# Patient Record
Sex: Male | Born: 2000 | Race: White | Hispanic: No | Marital: Single | Smoking: Never smoker
Health system: Southern US, Community
[De-identification: ages and names within clinical notes are randomized; demographics above are authoritative.]

## PROBLEM LIST (undated history)

## (undated) DIAGNOSIS — J45909 Unspecified asthma, uncomplicated: Secondary | ICD-10-CM

## (undated) DIAGNOSIS — K219 Gastro-esophageal reflux disease without esophagitis: Secondary | ICD-10-CM

## (undated) DIAGNOSIS — I370 Nonrheumatic pulmonary valve stenosis: Secondary | ICD-10-CM

## (undated) HISTORY — PX: OTHER SURGICAL HISTORY: SHX169

## (undated) HISTORY — DX: Nonrheumatic pulmonary valve stenosis: I37.0

## (undated) HISTORY — DX: Gastro-esophageal reflux disease without esophagitis: K21.9

## (undated) HISTORY — DX: Unspecified asthma, uncomplicated: J45.909

---
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
## 2021-06-14 DIAGNOSIS — R195 Other fecal abnormalities: Secondary | ICD-10-CM | POA: Diagnosis not present

## 2021-06-14 DIAGNOSIS — F419 Anxiety disorder, unspecified: Secondary | ICD-10-CM | POA: Diagnosis not present

## 2021-06-27 DIAGNOSIS — Z23 Encounter for immunization: Secondary | ICD-10-CM | POA: Diagnosis not present

## 2022-01-28 DIAGNOSIS — R109 Unspecified abdominal pain: Secondary | ICD-10-CM | POA: Diagnosis not present

## 2022-01-28 DIAGNOSIS — Z03818 Encounter for observation for suspected exposure to other biological agents ruled out: Secondary | ICD-10-CM | POA: Diagnosis not present

## 2022-01-28 DIAGNOSIS — R509 Fever, unspecified: Secondary | ICD-10-CM | POA: Diagnosis not present

## 2022-01-29 ENCOUNTER — Other Ambulatory Visit: Payer: Self-pay

## 2022-01-29 ENCOUNTER — Emergency Department (HOSPITAL_BASED_OUTPATIENT_CLINIC_OR_DEPARTMENT_OTHER)
Admission: EM | Admit: 2022-01-29 | Discharge: 2022-01-29 | Disposition: A | Discharge status: Home / Self Care | Payer: BC Managed Care – PPO | Attending: Emergency Medicine | Admitting: Emergency Medicine

## 2022-01-29 ENCOUNTER — Emergency Department (HOSPITAL_BASED_OUTPATIENT_CLINIC_OR_DEPARTMENT_OTHER): Payer: BC Managed Care – PPO

## 2022-01-29 ENCOUNTER — Encounter (HOSPITAL_BASED_OUTPATIENT_CLINIC_OR_DEPARTMENT_OTHER): Payer: Self-pay | Admitting: Emergency Medicine

## 2022-01-29 DIAGNOSIS — R197 Diarrhea, unspecified: Secondary | ICD-10-CM | POA: Insufficient documentation

## 2022-01-29 DIAGNOSIS — R110 Nausea: Secondary | ICD-10-CM | POA: Insufficient documentation

## 2022-01-29 DIAGNOSIS — R1013 Epigastric pain: Secondary | ICD-10-CM | POA: Insufficient documentation

## 2022-01-29 DIAGNOSIS — D696 Thrombocytopenia, unspecified: Secondary | ICD-10-CM | POA: Diagnosis not present

## 2022-01-29 DIAGNOSIS — R109 Unspecified abdominal pain: Secondary | ICD-10-CM | POA: Diagnosis not present

## 2022-01-29 DIAGNOSIS — R1033 Periumbilical pain: Secondary | ICD-10-CM | POA: Insufficient documentation

## 2022-01-29 DIAGNOSIS — R509 Fever, unspecified: Secondary | ICD-10-CM | POA: Insufficient documentation

## 2022-01-29 DIAGNOSIS — D72829 Elevated white blood cell count, unspecified: Secondary | ICD-10-CM | POA: Insufficient documentation

## 2022-01-29 LAB — CBC WITH DIFFERENTIAL/PLATELET
Abs Immature Granulocytes: 0.04 10*3/uL (ref 0.00–0.07)
Basophils Absolute: 0 10*3/uL (ref 0.0–0.1)
Basophils Relative: 0 %
Eosinophils Absolute: 0.1 10*3/uL (ref 0.0–0.5)
Eosinophils Relative: 1 %
HCT: 43 % (ref 39.0–52.0)
Hemoglobin: 14.6 g/dL (ref 13.0–17.0)
Immature Granulocytes: 0 %
Lymphocytes Relative: 4 %
Lymphs Abs: 0.5 10*3/uL — ABNORMAL LOW (ref 0.7–4.0)
MCH: 29 pg (ref 26.0–34.0)
MCHC: 34 g/dL (ref 30.0–36.0)
MCV: 85.3 fL (ref 80.0–100.0)
Monocytes Absolute: 1.1 10*3/uL — ABNORMAL HIGH (ref 0.1–1.0)
Monocytes Relative: 10 %
Neutro Abs: 9.7 10*3/uL — ABNORMAL HIGH (ref 1.7–7.7)
Neutrophils Relative %: 85 %
Platelets: 147 10*3/uL — ABNORMAL LOW (ref 150–400)
RBC: 5.04 MIL/uL (ref 4.22–5.81)
RDW: 11.8 % (ref 11.5–15.5)
WBC: 11.5 10*3/uL — ABNORMAL HIGH (ref 4.0–10.5)
nRBC: 0 % (ref 0.0–0.2)

## 2022-01-29 LAB — COMPREHENSIVE METABOLIC PANEL
ALT: 16 U/L (ref 0–44)
AST: 19 U/L (ref 15–41)
Albumin: 4.1 g/dL (ref 3.5–5.0)
Alkaline Phosphatase: 53 U/L (ref 38–126)
Anion gap: 12 (ref 5–15)
BUN: 8 mg/dL (ref 6–20)
CO2: 25 mmol/L (ref 22–32)
Calcium: 9.1 mg/dL (ref 8.9–10.3)
Chloride: 101 mmol/L (ref 98–111)
Creatinine, Ser: 0.83 mg/dL (ref 0.61–1.24)
GFR, Estimated: >60 mL/min (ref >60)
Glucose, Bld: 95 mg/dL (ref 70–99)
Potassium: 3.7 mmol/L (ref 3.5–5.1)
Sodium: 138 mmol/L (ref 135–145)
Total Bilirubin: 0.6 mg/dL (ref 0.3–1.2)
Total Protein: 7.1 g/dL (ref 6.5–8.1)

## 2022-01-29 LAB — URINALYSIS, ROUTINE W REFLEX MICROSCOPIC
Bilirubin Urine: NEGATIVE
Glucose, UA: NEGATIVE mg/dL
Hgb urine dipstick: NEGATIVE
Ketones, ur: 40 mg/dL — AB
Leukocytes,Ua: NEGATIVE
Nitrite: NEGATIVE
Protein, ur: 30 mg/dL — AB
Specific Gravity, Urine: >1.046 — ABNORMAL HIGH (ref 1.005–1.030)
pH: 6.5 (ref 5.0–8.0)

## 2022-01-29 LAB — LIPASE, BLOOD: Lipase: 16 U/L (ref 11–51)

## 2022-01-29 MED ORDER — ONDANSETRON 4 MG PO TBDP: 4.0000 mg | ORAL_TABLET | Freq: Three times a day (TID) | ORAL | 0 refills | Status: AC | PRN

## 2022-01-29 MED ORDER — DICYCLOMINE HCL 20 MG PO TABS: 20.0000 mg | ORAL_TABLET | Freq: Two times a day (BID) | ORAL | 0 refills | Status: AC

## 2022-01-29 MED ORDER — IOHEXOL 300 MG/ML  SOLN
100.0000 mL | Freq: Once | INTRAMUSCULAR | Status: AC | PRN
  Administered 2022-01-29: 75 mL via INTRAVENOUS

## 2022-01-29 NOTE — ED Triage Notes (Signed)
Pt from home c/o of high fever that was 102.9 along with nausea and epigastric abdominal pain. Fever continues to return. Abdominal pain is with exertion.  Pt was sent from his PCP today for elevated WBC.   Pt took tylenol at appox 1400 today before coming in.   Nausea has currently subsided.

## 2022-01-29 NOTE — ED Provider Notes (Signed)
[facility] EMERGENCY DEPT Provider Note   CSN: [id number] Arrival date & time: 01/29/22  1557     History  Chief Complaint  Patient presents with   Fever    Raymond Hoover is a 21 y.o. male who presents to the ED today with dad with complaint of fevers with TMAX 102.9 x 2 days. Pt also complains of nausea and epigastric pain. He reports pain is worse with movement/exertion however at rest it is manageable. He also has some mild diarrhea. He went to his PCP's office yesterday who drew bloodwork and tested patient for COVID, flu, and RSV all of which he was negative for. They called today and reported pt's WBC was 16,000 and advised him to come to the ED for further eval. Pt denies any recent sick contacts. No previous abd surgeries.   The history is provided by the patient, a parent and medical records.      Home Medications Prior to Admission medications   Medication Sig Start Date End Date Taking? Authorizing Provider  dicyclomine (BENTYL) 20 MG tablet Take 1 tablet (20 mg total) by mouth 2 (two) times daily. 01/29/22  Yes Cory Kitt, PA-C  ondansetron (ZOFRAN-ODT) 4 MG disintegrating tablet Take 1 tablet (4 mg total) by mouth every 8 (eight) hours as needed for nausea or vomiting. 01/29/22  Yes Abner Ardis, PA-C  albuterol (PROAIR HFA) 108 (90 BASE) MCG/ACT inhaler Inhale into the lungs.    [provider]  NEOMYCIN-POLYMYXIN-HYDROCORTISONE (CORTISPORIN) 1 % SOLN otic solution Apply 1-2 drops to toe BID after soaking 05/16/15   Hyatt, Max T, DPM      Allergies    Sulfa antibiotics    Review of Systems   Review of Systems  Constitutional:  Positive for fever.  HENT:  Negative for sore throat.   Respiratory:  Negative for cough and shortness of breath.   Cardiovascular:  Negative for chest pain.  Gastrointestinal:  Positive for abdominal pain, diarrhea and nausea. Negative for vomiting.  Genitourinary:  Negative for flank pain and frequency.  All  other systems reviewed and are negative.  Physical Exam Updated Vital Signs BP 116/75   Pulse 83   Temp 98.7 F (37.1 C) (Oral)   Resp 18   Ht 5\' 10"  (1.778 m)   Wt 66.2 kg   SpO2 98%   BMI 20.95 kg/m  Physical Exam Vitals and nursing note reviewed.  Constitutional:      Appearance: He is not ill-appearing or diaphoretic.  HENT:     Head: Normocephalic and atraumatic.  Eyes:     Conjunctiva/sclera: Conjunctivae normal.  Cardiovascular:     Rate and Rhythm: Normal rate and regular rhythm.     Pulses: Normal pulses.  Pulmonary:     Effort: Pulmonary effort is normal.     Breath sounds: Normal breath sounds. No wheezing, rhonchi or rales.  Abdominal:     Palpations: Abdomen is soft.     Tenderness: There is abdominal tenderness. There is no guarding or rebound.     Comments: Soft, + periumbilical and epigastric abd TTP, +BS throughout, no r/g/r, neg murphy's, neg mcburney's, no CVA TTP  Musculoskeletal:     Cervical back: Neck supple.  Skin:    General: Skin is warm and dry.  Neurological:     Mental Status: He is alert.    ED Results / Procedures / Treatments   Labs (all labs ordered are listed, but only abnormal results are displayed) Labs Reviewed  CBC WITH  DIFFERENTIAL/PLATELET - Abnormal; Notable for the following components:      Result Value   WBC 11.5 (*)    Platelets 147 (*)    Neutro Abs 9.7 (*)    Lymphs Abs 0.5 (*)    Monocytes Absolute 1.1 (*)    All other components within normal limits  COMPREHENSIVE METABOLIC PANEL  LIPASE, BLOOD  URINALYSIS, ROUTINE W REFLEX MICROSCOPIC    EKG None  Radiology No results found.  Procedures Procedures    Medications Ordered in ED Medications  iohexol (OMNIPAQUE) 300 MG/ML solution 100 mL (75 mLs Intravenous Contrast Given 01/29/22 1801)    ED Course/ Medical Decision Making/ A&P                           Medical Decision Making 21 year old male who presents to the ED today after seeing PCP  yesterday with complaint of fevers, nausea, diarrhea, abdominal pain.  Noted to have a white blood cell count of 16,000 advised to come to the ED for further eval.  On arrival to the ED today patient is afebrile, nontachycardic and nontachypneic.  He appears to be in no acute distress at this time.  He is laying comfortably in bed with dad at bedside.  He is noted to have some mild periumbilical abdominal tenderness palpation as well as epigastric abdominal tenderness palpation.  We will plan for repeat labs at this time as I am unable to see the labs in the system as well as CT scan for further eval.  Had offered fluids, antiemetics, pain medication however patient would like to hold off at this time.  We will continue to monitor in the ED.   CBC with leukocytosis 11,500 without left shift. Hgb stable. Platelets slightly low at 147 however do not have previous to compare to.  CMP without electrolyte abnormalities. LFTs unremarkable.  Lipase WNL at 16  Amount and/or Complexity of Data Reviewed Labs: ordered. Decision-making details documented in ED Course. Radiology: ordered.  Risk Prescription drug management.   At shift change case signed out to [facility], PA-C, who will follow up on CT scan and urinalysis and dispo accordingly. If negative pt can be discharged with PCP follow up. Zofran and Bentyl sent to pharmacy.         Final Clinical Impression(s) / ED Diagnoses Final diagnoses:  Periumbilical abdominal pain  Fever, unspecified fever cause    Rx / DC Orders ED Discharge Orders          Ordered    ondansetron (ZOFRAN-ODT) 4 MG disintegrating tablet  Every 8 hours PRN        01/29/22 1825    dicyclomine (BENTYL) 20 MG tablet  2 times daily        01/29/22 1827              Tanda Rockers, PA-C 01/29/22 1827    Franne Forts, DO 02/04/22 2345

## 2022-01-29 NOTE — ED Provider Notes (Signed)
Accepted handoff at shift change from [facility]. Please see prior provider note for more detail.   Briefly: Patient is 21 y.o.   2 days of fever and abdominal pain.  Was seen at PCP office yesterday and found to have a WBC of 16,000 and sent to emergency room  DDX: concern for appendicitis  Plan: FU on CT scan and urinalysis     Physical Exam  BP 116/75   Pulse 83   Temp 98.7 F (37.1 C) (Oral)   Resp 18   Ht 5\' 10"  (1.778 m)   Wt 66.2 kg   SpO2 98%   BMI 20.95 kg/m   Physical Exam Vitals and nursing note reviewed.  Constitutional:      General: He is not in acute distress. HENT:     Head: Normocephalic and atraumatic.     Nose: Nose normal.  Eyes:     General: No scleral icterus. Cardiovascular:     Rate and Rhythm: Normal rate and regular rhythm.     Pulses: Normal pulses.     Heart sounds: Normal heart sounds.  Pulmonary:     Effort: Pulmonary effort is normal. No respiratory distress.     Breath sounds: No wheezing.  Abdominal:     Palpations: Abdomen is soft.     Tenderness: There is abdominal tenderness.     Comments: Some diffuse lower abdominal tenderness.  No guarding or rebound  Musculoskeletal:     Cervical back: Normal range of motion.     Right lower leg: No edema.     Left lower leg: No edema.  Skin:    General: Skin is warm and dry.     Capillary Refill: Capillary refill takes less than 2 seconds.  Neurological:     Mental Status: He is alert. Mental status is at baseline.  Psychiatric:        Mood and Affect: Mood normal.        Behavior: Behavior normal.    Procedures  Procedures Results for orders placed or performed during the hospital encounter of 01/29/22  CBC with Differential  Result Value Ref Range   WBC 11.5 (H) 4.0 - 10.5 K/uL   RBC 5.04 4.22 - 5.81 MIL/uL   Hemoglobin 14.6 13.0 - 17.0 g/dL   HCT 01/31/22 64.3 - 32.9 %   MCV 85.3 80.0 - 100.0 fL   MCH 29.0 26.0 - 34.0 pg   MCHC 34.0 30.0 - 36.0 g/dL   RDW 51.8 84.1 -  66.0 %   Platelets 147 (L) 150 - 400 K/uL   nRBC 0.0 0.0 - 0.2 %   Neutrophils Relative % 85 %   Neutro Abs 9.7 (H) 1.7 - 7.7 K/uL   Lymphocytes Relative 4 %   Lymphs Abs 0.5 (L) 0.7 - 4.0 K/uL   Monocytes Relative 10 %   Monocytes Absolute 1.1 (H) 0.1 - 1.0 K/uL   Eosinophils Relative 1 %   Eosinophils Absolute 0.1 0.0 - 0.5 K/uL   Basophils Relative 0 %   Basophils Absolute 0.0 0.0 - 0.1 K/uL   Immature Granulocytes 0 %   Abs Immature Granulocytes 0.04 0.00 - 0.07 K/uL  Comprehensive metabolic panel  Result Value Ref Range   Sodium 138 135 - 145 mmol/L   Potassium 3.7 3.5 - 5.1 mmol/L   Chloride 101 98 - 111 mmol/L   CO2 25 22 - 32 mmol/L   Glucose, Bld 95 70 - 99 mg/dL   BUN 8 6 -  20 mg/dL   Creatinine, Ser 3.82 0.61 - 1.24 mg/dL   Calcium 9.1 8.9 - 50.5 mg/dL   Total Protein 7.1 6.5 - 8.1 g/dL   Albumin 4.1 3.5 - 5.0 g/dL   AST 19 15 - 41 U/L   ALT 16 0 - 44 U/L   Alkaline Phosphatase 53 38 - 126 U/L   Total Bilirubin 0.6 0.3 - 1.2 mg/dL   GFR, Estimated >39 >76 mL/min   Anion gap 12 5 - 15  Lipase, blood  Result Value Ref Range   Lipase 16 11 - 51 U/L   No results found.  ED Course / MDM   Clinical Course as of 01/29/22 1932  Tue Jan 29, 2022  1928 Discussed with Dr. Londell Moh in [facility] radiology.  On his over read he is able to see the appendix and it does not have any stranding or enlargement around it.  He also appreciates the mesenteric lymphadenitis that was seen on the original read.  Appendix 7.6 mm without stranding.  [WF]    Clinical Course User Index [WF] Gailen Shelter, PA   Medical Decision Making Amount and/or Complexity of Data Reviewed Labs: ordered. Radiology: ordered.  Risk Prescription drug management.    Urinalysis consistent with some dehydration.  CMP unremarkable CBC with mild leukocytosis that is neutrophil predominant.  Lipase within normal limits.  7:31 PM Discussed with Dr. Londell Moh in [facility] radiology.  On his over  read he is able to see the appendix and it does not have any stranding or enlargement around it.  He also appreciates the mesenteric lymphadenitis that was seen on the original read.  Appendix 7.6 mm without stranding.   We will discharge home with 48-hour follow-up, Zofran and Bentyl prescribed by prior provider.  I recommended Tylenol ibuprofen and very close monitoring of symptoms.    Gailen Shelter, [place] 01/29/22 1932    Franne Forts, DO 02/04/22 [id number]

## 2022-01-29 NOTE — Discharge Instructions (Addendum)
Your workup was overall reassuring in the ED today. It is recommended that you follow back up with your PCP for further evaluation.   Pick up medications and take as needed for nausea.   Return to the ED for any new/worsening symptoms   Please use Tylenol or ibuprofen for pain.  You may use 600 mg ibuprofen every 6 hours or 1000 mg of Tylenol every 6 hours.  You may choose to alternate between the 2.  This would be most effective.  Not to exceed 4 g of Tylenol within 24 hours.  Not to exceed 3200 mg ibuprofen 24 hours.   Please follow-up with a primary care provider in the next 48 hours for reevaluation.  You may always return to the emergency room for any new or concerning symptoms.

## 2022-02-01 DIAGNOSIS — I880 Nonspecific mesenteric lymphadenitis: Secondary | ICD-10-CM | POA: Diagnosis not present

## 2022-02-01 DIAGNOSIS — R509 Fever, unspecified: Secondary | ICD-10-CM | POA: Diagnosis not present

## 2022-02-05 DIAGNOSIS — R509 Fever, unspecified: Secondary | ICD-10-CM | POA: Diagnosis not present

## 2022-05-09 DIAGNOSIS — R634 Abnormal weight loss: Secondary | ICD-10-CM | POA: Diagnosis not present

## 2022-05-09 DIAGNOSIS — I880 Nonspecific mesenteric lymphadenitis: Secondary | ICD-10-CM | POA: Diagnosis not present

## 2022-05-09 DIAGNOSIS — L609 Nail disorder, unspecified: Secondary | ICD-10-CM | POA: Diagnosis not present

## 2022-05-09 DIAGNOSIS — R110 Nausea: Secondary | ICD-10-CM | POA: Diagnosis not present

## 2022-06-10 DIAGNOSIS — L219 Seborrheic dermatitis, unspecified: Secondary | ICD-10-CM | POA: Diagnosis not present

## 2022-06-10 DIAGNOSIS — L650 Telogen effluvium: Secondary | ICD-10-CM | POA: Diagnosis not present

## 2022-07-01 DIAGNOSIS — R634 Abnormal weight loss: Secondary | ICD-10-CM | POA: Diagnosis not present

## 2022-07-01 DIAGNOSIS — R110 Nausea: Secondary | ICD-10-CM | POA: Diagnosis not present

## 2022-07-01 DIAGNOSIS — K219 Gastro-esophageal reflux disease without esophagitis: Secondary | ICD-10-CM | POA: Diagnosis not present

## 2022-07-01 DIAGNOSIS — R1013 Epigastric pain: Secondary | ICD-10-CM | POA: Diagnosis not present

## 2022-07-25 DIAGNOSIS — R1013 Epigastric pain: Secondary | ICD-10-CM | POA: Diagnosis not present

## 2022-07-25 DIAGNOSIS — K2930 Chronic superficial gastritis without bleeding: Secondary | ICD-10-CM | POA: Diagnosis not present

## 2022-07-25 DIAGNOSIS — R110 Nausea: Secondary | ICD-10-CM | POA: Diagnosis not present

## 2022-07-25 DIAGNOSIS — R634 Abnormal weight loss: Secondary | ICD-10-CM | POA: Diagnosis not present

## 2022-08-07 DIAGNOSIS — H5203 Hypermetropia, bilateral: Secondary | ICD-10-CM | POA: Diagnosis not present

## 2022-08-21 DIAGNOSIS — R634 Abnormal weight loss: Secondary | ICD-10-CM | POA: Diagnosis not present

## 2022-08-21 DIAGNOSIS — R110 Nausea: Secondary | ICD-10-CM | POA: Diagnosis not present

## 2022-09-25 DIAGNOSIS — U071 COVID-19: Secondary | ICD-10-CM | POA: Diagnosis not present

## 2022-10-08 DIAGNOSIS — K2970 Gastritis, unspecified, without bleeding: Secondary | ICD-10-CM | POA: Diagnosis not present

## 2023-02-20 DIAGNOSIS — F419 Anxiety disorder, unspecified: Secondary | ICD-10-CM | POA: Diagnosis not present

## 2023-02-20 DIAGNOSIS — K582 Mixed irritable bowel syndrome: Secondary | ICD-10-CM | POA: Diagnosis not present

## 2023-02-20 DIAGNOSIS — K219 Gastro-esophageal reflux disease without esophagitis: Secondary | ICD-10-CM | POA: Diagnosis not present

## 2023-08-21 DIAGNOSIS — Z0000 Encounter for general adult medical examination without abnormal findings: Secondary | ICD-10-CM | POA: Diagnosis not present

## 2023-09-11 DIAGNOSIS — Z1159 Encounter for screening for other viral diseases: Secondary | ICD-10-CM | POA: Diagnosis not present

## 2023-09-11 DIAGNOSIS — Z0000 Encounter for general adult medical examination without abnormal findings: Secondary | ICD-10-CM | POA: Diagnosis not present

## 2023-09-11 DIAGNOSIS — Z13220 Encounter for screening for lipoid disorders: Secondary | ICD-10-CM | POA: Diagnosis not present

## 2023-09-11 DIAGNOSIS — Z83438 Family history of other disorder of lipoprotein metabolism and other lipidemia: Secondary | ICD-10-CM | POA: Diagnosis not present

## 2023-09-18 DIAGNOSIS — Z23 Encounter for immunization: Secondary | ICD-10-CM | POA: Diagnosis not present

## 2023-11-27 DIAGNOSIS — K219 Gastro-esophageal reflux disease without esophagitis: Secondary | ICD-10-CM | POA: Diagnosis not present

## 2023-11-27 DIAGNOSIS — R1310 Dysphagia, unspecified: Secondary | ICD-10-CM | POA: Diagnosis not present

## 2023-11-27 DIAGNOSIS — Z711 Person with feared health complaint in whom no diagnosis is made: Secondary | ICD-10-CM | POA: Diagnosis not present

## 2024-01-19 DIAGNOSIS — F4321 Adjustment disorder with depressed mood: Secondary | ICD-10-CM | POA: Diagnosis not present

## 2024-01-26 DIAGNOSIS — F4321 Adjustment disorder with depressed mood: Secondary | ICD-10-CM | POA: Diagnosis not present

## 2024-02-03 DIAGNOSIS — F4321 Adjustment disorder with depressed mood: Secondary | ICD-10-CM | POA: Diagnosis not present

## 2024-02-07 ENCOUNTER — Encounter (HOSPITAL_BASED_OUTPATIENT_CLINIC_OR_DEPARTMENT_OTHER): Payer: Self-pay

## 2024-02-07 ENCOUNTER — Emergency Department (HOSPITAL_BASED_OUTPATIENT_CLINIC_OR_DEPARTMENT_OTHER)

## 2024-02-07 ENCOUNTER — Emergency Department (HOSPITAL_BASED_OUTPATIENT_CLINIC_OR_DEPARTMENT_OTHER)
Admission: EM | Admit: 2024-02-07 | Discharge: 2024-02-07 | Disposition: A | Discharge status: Home / Self Care | Attending: Emergency Medicine | Admitting: Emergency Medicine

## 2024-02-07 ENCOUNTER — Other Ambulatory Visit: Payer: Self-pay

## 2024-02-07 DIAGNOSIS — R002 Palpitations: Secondary | ICD-10-CM | POA: Insufficient documentation

## 2024-02-07 DIAGNOSIS — R079 Chest pain, unspecified: Secondary | ICD-10-CM | POA: Diagnosis not present

## 2024-02-07 LAB — CBC
HCT: 45.7 % (ref 39.0–52.0)
Hemoglobin: 15.6 g/dL (ref 13.0–17.0)
MCH: 29.7 pg (ref 26.0–34.0)
MCHC: 34.1 g/dL (ref 30.0–36.0)
MCV: 87 fL (ref 80.0–100.0)
Platelets: 264 10*3/uL (ref 150–400)
RBC: 5.25 MIL/uL (ref 4.22–5.81)
RDW: 11.8 % (ref 11.5–15.5)
WBC: 10.9 10*3/uL — ABNORMAL HIGH (ref 4.0–10.5)
nRBC: 0 % (ref 0.0–0.2)

## 2024-02-07 LAB — D-DIMER, QUANTITATIVE: D-Dimer, Quant: <0.27 ug{FEU}/mL (ref 0.00–0.50)

## 2024-02-07 LAB — BASIC METABOLIC PANEL WITH GFR
Anion gap: 15 (ref 5–15)
BUN: 14 mg/dL (ref 6–20)
CO2: 26 mmol/L (ref 22–32)
Calcium: 9.9 mg/dL (ref 8.9–10.3)
Chloride: 99 mmol/L (ref 98–111)
Creatinine, Ser: 1.01 mg/dL (ref 0.61–1.24)
GFR, Estimated: >60 mL/min (ref >60)
Glucose, Bld: 125 mg/dL — ABNORMAL HIGH (ref 70–99)
Potassium: 3.4 mmol/L — ABNORMAL LOW (ref 3.5–5.1)
Sodium: 140 mmol/L (ref 135–145)

## 2024-02-07 LAB — TROPONIN T, HIGH SENSITIVITY: Troponin T High Sensitivity: <15 ng/L (ref <19)

## 2024-02-07 MED ORDER — LACTATED RINGERS IV BOLUS
1000.0000 mL | Freq: Once | INTRAVENOUS | Status: AC
Start: 2024-02-07 — End: 2024-02-07
  Administered 2024-02-07: 1000 mL via INTRAVENOUS

## 2024-02-07 NOTE — ED Provider Notes (Signed)
 [facility] EMERGENCY DEPARTMENT AT [facility] Provider Note   CSN: [id number] Arrival date & time: 02/07/24  0110     History Chief Complaint  Patient presents with   Chest Pain    HPI Raymond Hoover is a 23 y.o. male presenting for chief complaint of chest pain.  States that he has a history of near syncope and that he was at a concert today.  Decreased p.o. intake Patient's recorded medical, surgical, social, medication list and allergies were reviewed in the Snapshot window as part of the initial history.   Review of Systems   Review of Systems  Constitutional:  Negative for chills and fever.  HENT:  Negative for ear pain and sore throat.   Eyes:  Negative for pain and visual disturbance.  Respiratory:  Negative for cough and shortness of breath.   Cardiovascular:  Positive for chest pain and palpitations.  Gastrointestinal:  Negative for abdominal pain and vomiting.  Genitourinary:  Negative for dysuria and hematuria.  Musculoskeletal:  Negative for arthralgias and back pain.  Skin:  Negative for color change and rash.  Neurological:  Negative for seizures and syncope.  All other systems reviewed and are negative.   Physical Exam Updated Vital Signs BP 123/85   Pulse 86   Temp 99.7 F (37.6 C) (Oral)   Resp 18   Ht 5\' 11"  (1.803 m)   Wt 61.2 kg   SpO2 98%   BMI 18.83 kg/m  Physical Exam Vitals and nursing note reviewed.  Constitutional:      General: He is not in acute distress.    Appearance: He is well-developed.  HENT:     Head: Normocephalic and atraumatic.  Eyes:     Conjunctiva/sclera: Conjunctivae normal.  Cardiovascular:     Rate and Rhythm: Normal rate and regular rhythm.     Heart sounds: No murmur heard. Pulmonary:     Effort: Pulmonary effort is normal. No respiratory distress.     Breath sounds: Normal breath sounds.  Abdominal:     Palpations: Abdomen is soft.     Tenderness: There is no abdominal tenderness.  Musculoskeletal:         General: No swelling.     Cervical back: Neck supple.  Skin:    General: Skin is warm and dry.     Capillary Refill: Capillary refill takes less than 2 seconds.  Neurological:     Mental Status: He is alert.  Psychiatric:        Mood and Affect: Mood normal.      ED Course/ Medical Decision Making/ A&P    Procedures Procedures   Medications Ordered in ED Medications  lactated ringers bolus 1,000 mL (0 mLs Intravenous Stopped 02/07/24 0154)   Medical Decision Making: [name] is a 23 y.o. male who presented to the ED today with chest pain, detailed above.  Based on patient's comorbidities, patient has a heart score of 0.    Patient placed on continuous vitals and telemetry monitoring while in ED which was reviewed periodically.  Complete initial physical exam performed, notably the patient was HDS in NAD.   Reviewed and confirmed nursing documentation for past medical history, family history, social history.    Initial Assessment: With the patient's presentation of left-sided chest pain, most likely diagnosis is musculoskeletal chest pain versus GERD, although ACS remains on the differential. Other diagnoses were considered including (but not limited to) pulmonary embolism, community-acquired pneumonia, aortic dissection, pneumothorax, underlying bony abnormality, anemia. These are  considered less likely due to history of present illness and physical exam findings.    In particular, concerning pulmonary embolism: Patient is PERC + and the they deny malignancy, recent surgery, history of DVT, or calf tenderness leading to a low risk Wells score. Aortic Dissection also reconsidered but seems less likely based on the location, quality, onset, and severity of symptoms in this case. Patient has a lack of serious comorbidities for this condition including a lack of HTN or Smoking. Patient also has a lack of underlying history of AD or TAA.  This is most consistent with an acute  life/limb threatening illness complicated by underlying chronic conditions.   Initial Plan: EKG and single troponin to evaluate for cardiac pathology. Single troponin appropriate due to greater than 6 hours since maximal intensity of symptoms. Evaluate for dissection, bony abnormality, or pneumonia with chest x-ray and screening laboratory evaluation including CBC, BMP  Further evaluation for pulmonary embolism  indicated at this time based on patient's PERC and Wells score. Ddimer since low Wells Further evaluation for Thoracic Aortic Dissection not indicated at this time based on patient's clinical history and PE findings.   Initial Study Results: KG was reviewed independently. Rate, rhythm, axis, intervals all examined and without medically relevant abnormality. ST segments without concerns for elevations.    Laboratory  Single troponin demonstrated NAA   CBC and BMP without obvious metabolic or inflammatory abnormalities requiring further evaluation   Radiology  DG Chest Port 1 View Result Date: 02/07/2024 CLINICAL DATA:  Palpitations and chest pain. EXAM: PORTABLE CHEST 1 VIEW COMPARISON:  None Available. FINDINGS: Heart size and pulmonary vascularity are normal. Lungs are clear. No pleural effusions or pneumothorax. Thoracolumbar scoliosis. IMPRESSION: No active disease. Electronically Signed   By: Boyce Byes M.D.   On: 02/07/2024 01:49    Final Assessment and Plan: D-dimer showed no acute pathology. Troponin negative.  Heart rate down trended after IV fluids.  Patient symptomatically improved.  Supportive care reinforced patient ambulatory tolerating p.o. intake.  Disposition:  I have considered need for hospitalization, however, considering all of the above, I believe this patient is stable for discharge at this time.  Patient/family educated about specific return precautions for given chief complaint and symptoms.  Patient/family educated about follow-up with PCP.      Patient/family expressed understanding of return precautions and need for follow-up. Patient spoken to regarding all imaging and laboratory results and appropriate follow up for these results. All education provided in verbal form with additional information in written form. Time was allowed for answering of patient questions. Patient discharged.    Emergency Department Medication Summary:   Medications  lactated ringers bolus 1,000 mL (0 mLs Intravenous Stopped 02/07/24 0154)                  Clinical Impression:  1. Palpitations      Discharge   Final Clinical Impression(s) / ED Diagnoses Final diagnoses:  Palpitations    Rx / DC Orders ED Discharge Orders     None         Onetha Bile, MD 02/07/24 [phone]

## 2024-02-07 NOTE — ED Triage Notes (Signed)
 Complaining of heart palpitations and chest pain that started at 9 pm. Denies any nausea or vomiting. Started after eating his supper.

## 2024-02-07 NOTE — ED Notes (Signed)
 ED Provider at bedside.

## 2024-02-09 DIAGNOSIS — F4321 Adjustment disorder with depressed mood: Secondary | ICD-10-CM | POA: Diagnosis not present

## 2024-02-16 DIAGNOSIS — F4321 Adjustment disorder with depressed mood: Secondary | ICD-10-CM | POA: Diagnosis not present

## 2024-02-23 DIAGNOSIS — F4321 Adjustment disorder with depressed mood: Secondary | ICD-10-CM | POA: Diagnosis not present

## 2024-03-01 DIAGNOSIS — F4321 Adjustment disorder with depressed mood: Secondary | ICD-10-CM | POA: Diagnosis not present

## 2024-03-08 DIAGNOSIS — F4321 Adjustment disorder with depressed mood: Secondary | ICD-10-CM | POA: Diagnosis not present

## 2024-03-15 DIAGNOSIS — F4321 Adjustment disorder with depressed mood: Secondary | ICD-10-CM | POA: Diagnosis not present

## 2024-03-22 DIAGNOSIS — F4321 Adjustment disorder with depressed mood: Secondary | ICD-10-CM | POA: Diagnosis not present

## 2024-03-29 DIAGNOSIS — F4321 Adjustment disorder with depressed mood: Secondary | ICD-10-CM | POA: Diagnosis not present

## 2024-04-02 DIAGNOSIS — R000 Tachycardia, unspecified: Secondary | ICD-10-CM | POA: Diagnosis not present

## 2024-04-02 DIAGNOSIS — R0789 Other chest pain: Secondary | ICD-10-CM | POA: Diagnosis not present

## 2024-04-05 DIAGNOSIS — F4321 Adjustment disorder with depressed mood: Secondary | ICD-10-CM | POA: Diagnosis not present

## 2024-04-12 DIAGNOSIS — F4321 Adjustment disorder with depressed mood: Secondary | ICD-10-CM | POA: Diagnosis not present

## 2024-04-12 IMAGING — CT CT ABD-PELV W/ CM
2 of 4 series · 16 of 46 positions shown, 18 images · IV contrast (APPLIED)
Comparison: None Available.

CLINICAL DATA: Abdominal pain, acute, nonlocalized

EXAM:
CT ABDOMEN AND PELVIS WITH CONTRAST
TECHNIQUE: Multidetector CT imaging of the abdomen and pelvis was performed
using the standard protocol following bolus administration of
intravenous contrast.

[Series 2: abd pel w · axial · 0.68mm/px · z∈[-372,+18]mm · 13 of 86 slices shown, 15 images]
[im 4/86  soft-tissue]
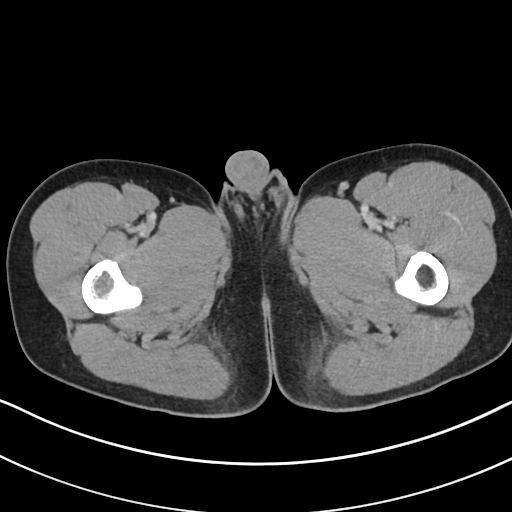
[im 4/86  bone]
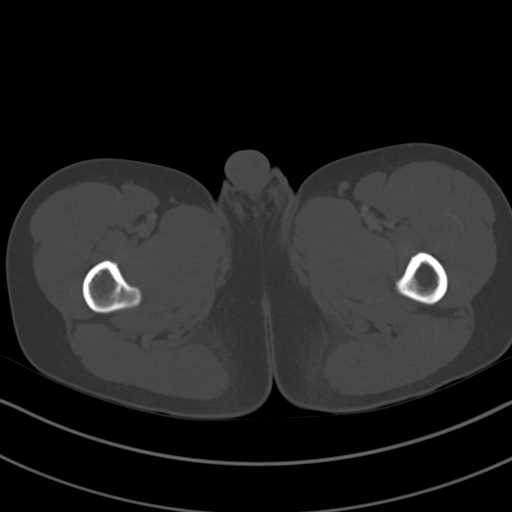
[im 11/86  soft-tissue]
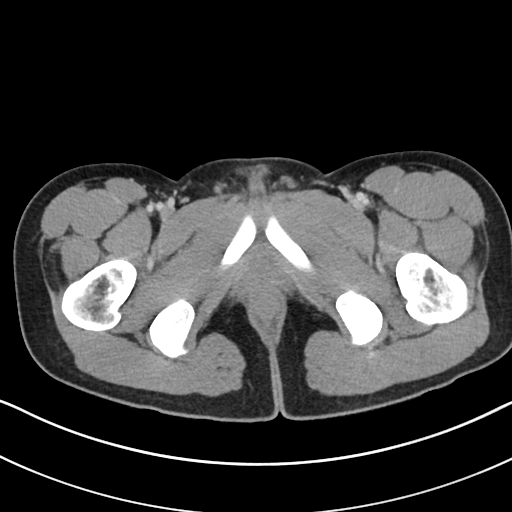
[im 18/86  soft-tissue]
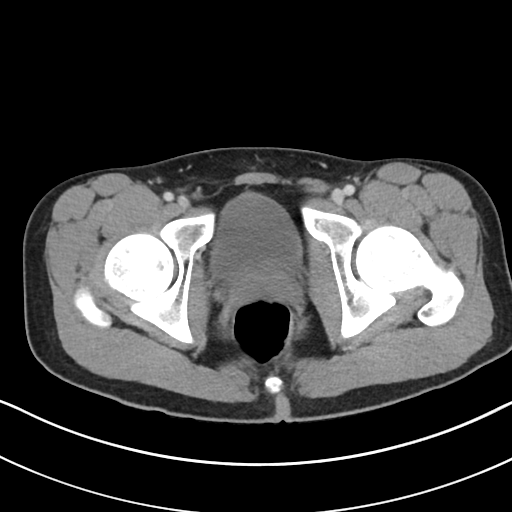
[im 24/86  soft-tissue]
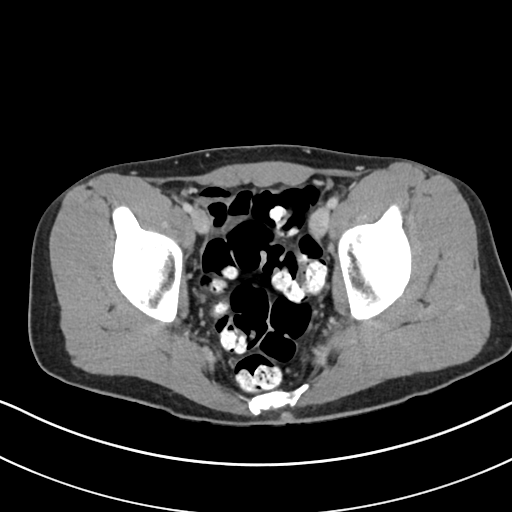
[im 31/86  soft-tissue]
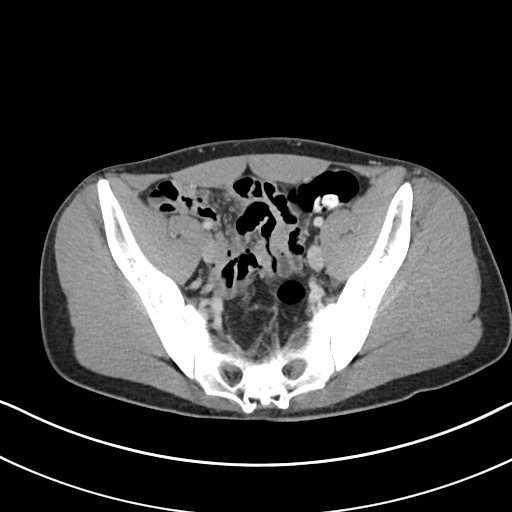
[im 38/86  soft-tissue]
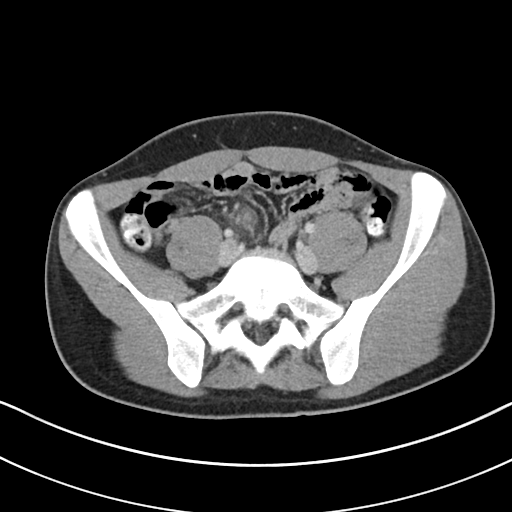
[im 45/86  soft-tissue]
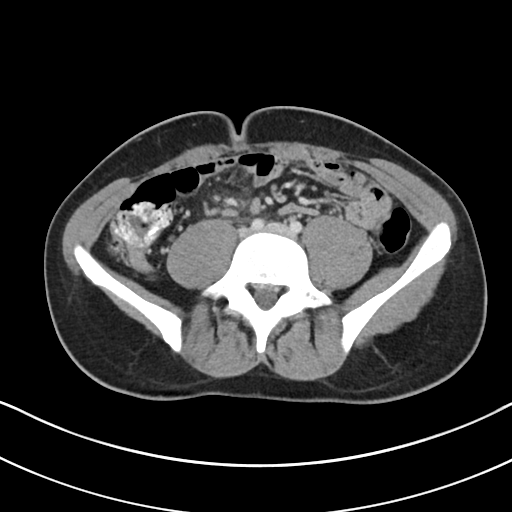
[im 48/86  soft-tissue]
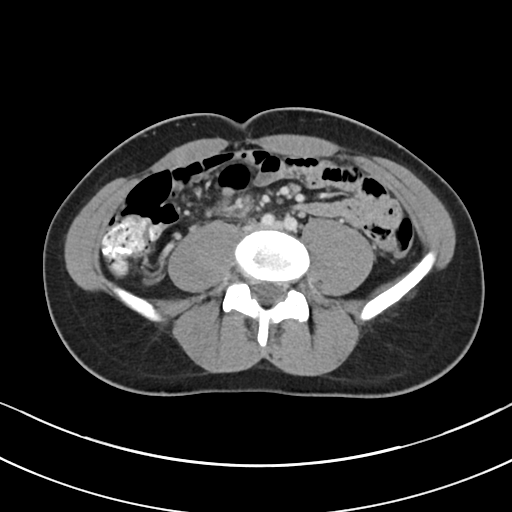
[im 55/86  soft-tissue]
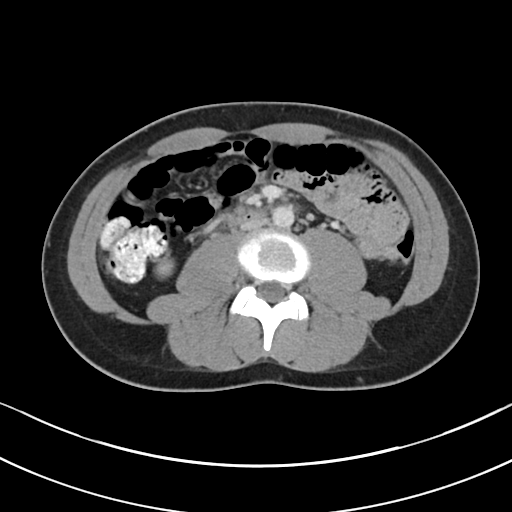
[im 55/86  bone]
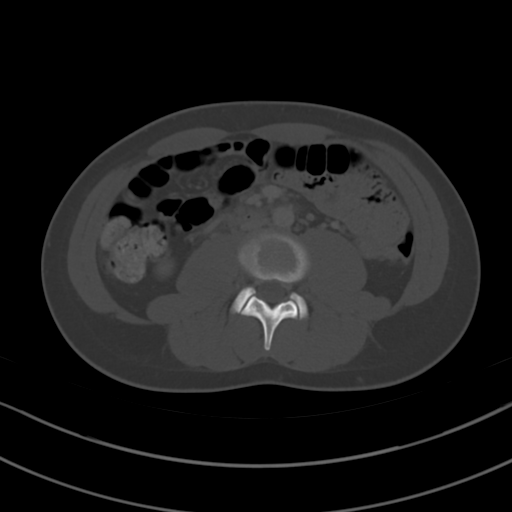
[im 62/86  soft-tissue]
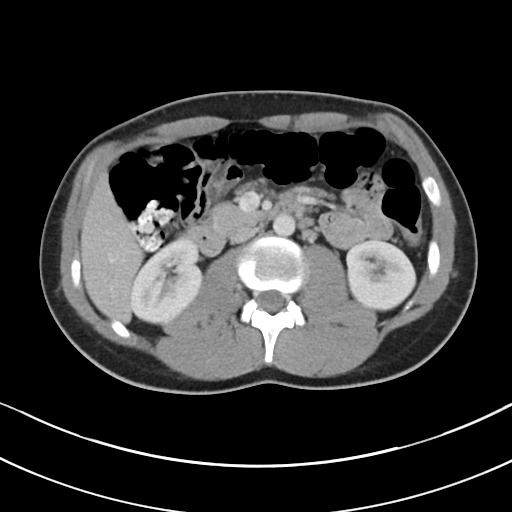
[im 69/86  soft-tissue]
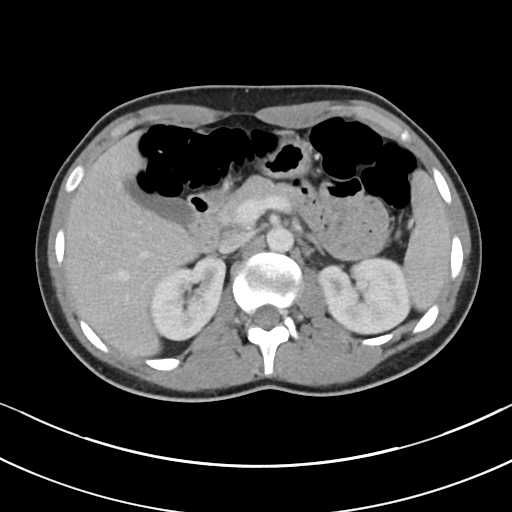
[im 75/86  soft-tissue]
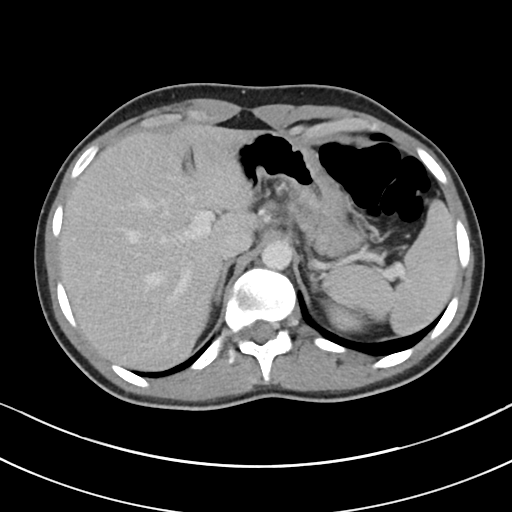
[im 82/86  soft-tissue]
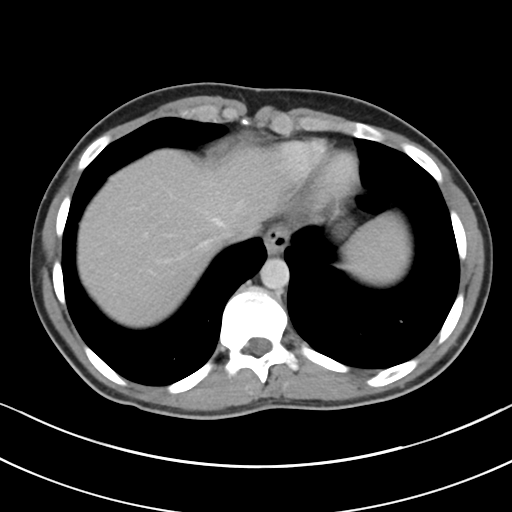

[Series 5: coronal · coronal · 0.69mm/px · 3 of 80 slices shown]
[im 27/80  soft-tissue]
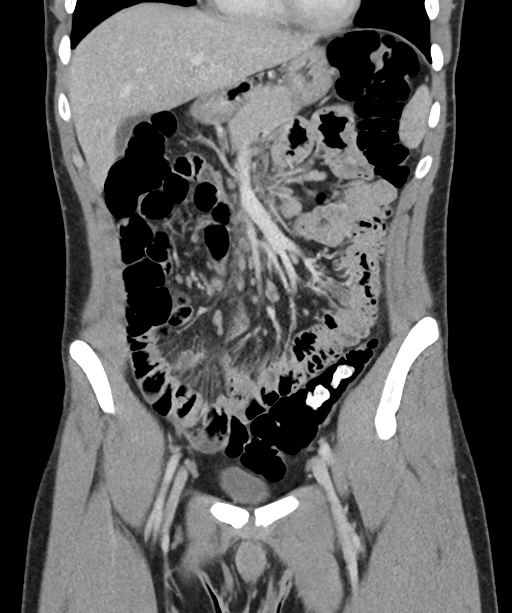
[im 36/80  soft-tissue]
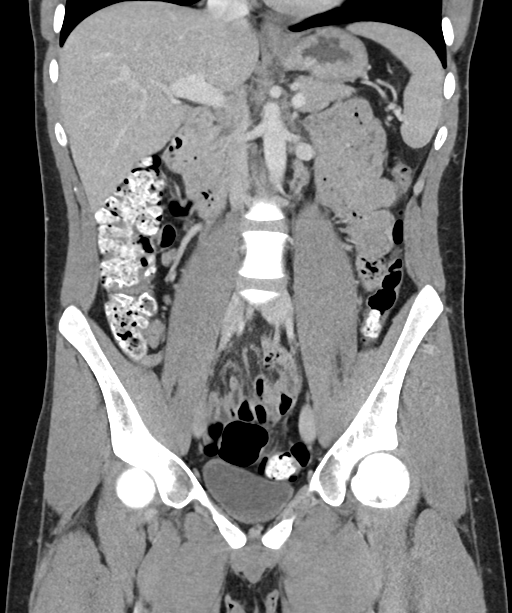
[im 44/80  soft-tissue]
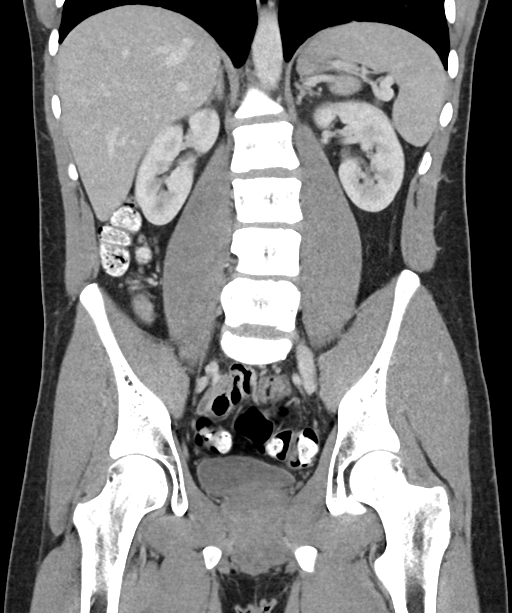

[16 of 46 positions shown; findings below may reference images not displayed]

RADIATION DOSE REDUCTION: This exam was performed according to the
departmental dose-optimization program which includes automated
exposure control, adjustment of the mA and/or kV according to
patient size and/or use of iterative reconstruction technique.

CONTRAST:  75mL OMNIPAQUE IOHEXOL 300 MG/ML  SOLN
FINDINGS: Lower chest: No acute abnormality.

Hepatobiliary: No focal liver abnormality is seen. No gallstones,
gallbladder wall thickening, or biliary dilatation.

Pancreas: Unremarkable. No pancreatic ductal dilatation or
surrounding inflammatory changes.

Spleen: Normal in size without focal abnormality.

Adrenals/Urinary Tract: Adrenal glands are unremarkable. Kidneys are
normal, without renal calculi, focal lesion, or hydronephrosis.
Bladder is unremarkable.

Stomach/Bowel: Stomach is within normal limits. Appendix is not
identified. No evidence of bowel wall thickening, or distention.
There is no inflammatory stranding at the pericecal region. No free
fluid or abscess.

Vascular/Lymphatic: No significant vascular findings are present.
There is mesenteric lymphadenitis seen with multiple mesenteric
lymph nodes. The representative lymph node at the paramedian upper
pelvis (image 45 of series 2) measures 1.2 cm. There is some
inflammatory stranding seen around this lymph node. The lymph node
at the left upper abdomen (image 54 of series 5) measures 1.2 cm.
There is no significant retroperitoneal or pelvic lymphadenopathy.

Reproductive: Prostate is unremarkable.

Other: No abdominal wall hernia or abnormality. No abdominopelvic
ascites.

Musculoskeletal: No acute or significant osseous findings.
IMPRESSION: Mesenteric lymphadenitis likely acute. The largest lymph node at the
right upper pelvis and left upper abdomen measures 1.2 cm in the
short axis. There is some inflammatory stranding seen around the
pelvic lymph node.

Appendix appears to be surgically absent. Clinical correlation is
suggested.

## 2024-04-13 NOTE — Progress Notes (Unsigned)
 Referring-Ryan Welborn, DO Reason for referral-chest pain and tachycardia  HPI: 23 year old male for evaluation of chest pain and tachycardia at request of Bernardino Boone, DO.  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  Patient seen in emergency room with complaints of chest pain May 2025.  Chest x-ray with no infiltrates, D-dimer normal, troponin T < 15, hemoglobin 15.6, potassium 3.4.  Pain felt likely musculoskeletal in etiology.  Heart rate initially in the 120s but improved with hydration.  Cardiology now asked to evaluate.  Current Outpatient Medications  Medication Sig Dispense Refill   albuterol (PROAIR HFA) 108 (90 BASE) MCG/ACT inhaler Inhale into the lungs.     dicyclomine  (BENTYL ) 20 MG tablet Take 1 tablet (20 mg total) by mouth 2 (two) times daily. 20 tablet 0   dicyclomine  (BENTYL ) 20 MG tablet Take 1 tablet (20 mg total) by mouth 2 (two) times daily. 20 tablet 0   NEOMYCIN -POLYMYXIN-HYDROCORTISONE (CORTISPORIN) 1 % SOLN otic solution Apply 1-2 drops to toe BID after soaking 10 mL 1   ondansetron  (ZOFRAN -ODT) 4 MG disintegrating tablet Take 1 tablet (4 mg total) by mouth every 8 (eight) hours as needed for nausea or vomiting. 20 tablet 0   ondansetron  (ZOFRAN -ODT) 4 MG disintegrating tablet Take 1 tablet (4 mg total) by mouth every 8 (eight) hours as needed for nausea or vomiting. 20 tablet 0   No current facility-administered medications for this visit.    Allergies  Allergen Reactions   Sulfa Antibiotics Rash    No past medical history on file.  No past surgical history on file.  Social History   Socioeconomic History   Marital status: Single    Spouse name: Not on file   Number of children: Not on file   Years of education: Not on file   Highest education level: Not on file  Occupational History   Not  on file  Tobacco Use   Smoking status: Never   Smokeless tobacco: Never  Substance and Sexual Activity   Alcohol use: No    Alcohol/week: 0.0 standard drinks of alcohol   Drug use: No   Sexual activity: Not on file  Other Topics Concern   Not on file  Social History Narrative   Not on file   Social Drivers of Health   Financial Resource Strain: Not on file  Food Insecurity: Not on file  Transportation Needs: Not on file  Physical Activity: Not on file  Stress: Not on file  Social Connections: Not on file  Intimate Partner Violence: Not on file    No family history on file.  ROS: no fevers or chills, productive cough, hemoptysis, dysphasia, odynophagia, melena, hematochezia, dysuria, hematuria, rash, seizure activity, orthopnea, PND, pedal edema, claudication. Remaining systems are negative.  Physical Exam:   There were no vitals taken for this visit.  General:  Well developed/well nourished in NAD Skin warm/dry Patient not depressed No peripheral clubbing Back-normal HEENT-normal/normal eyelids Neck supple/normal carotid upstroke bilaterally; no bruits; no JVD; no thyromegaly chest - CTA/ normal expansion CV - RRR/normal S1 and S2; no murmurs, rubs or gallops;  PMI nondisplaced Abdomen -NT/ND, no HSM, no mass, + bowel sounds, no bruit 2+ femoral pulses, no bruits Ext-no edema, chords, 2+ DP Neuro-grossly nonfocal  ECG -Feb 07, 2024-sinus tachycardia, right axis deviation.  Personally reviewed  A/P  1 recent episode  of chest pain-troponin was normal as was D-dimer.  Electrocardiogram showed no acute ST changes.  2 sinus tachycardia-noted to have sinus tachycardia during recent admission which improved with hydration.  3 palpitations-  Redell Shallow, MD

## 2024-04-14 ENCOUNTER — Ambulatory Visit: Attending: Cardiology | Admitting: Cardiology

## 2024-04-14 ENCOUNTER — Encounter: Payer: Self-pay | Admitting: Cardiology

## 2024-04-14 VITALS — BP 110/82 | HR 75 | Ht 71.0 in | Wt 128.0 lb

## 2024-04-14 DIAGNOSIS — R000 Tachycardia, unspecified: Secondary | ICD-10-CM

## 2024-04-14 DIAGNOSIS — I370 Nonrheumatic pulmonary valve stenosis: Secondary | ICD-10-CM

## 2024-04-14 DIAGNOSIS — R072 Precordial pain: Secondary | ICD-10-CM

## 2024-04-14 NOTE — Patient Instructions (Signed)
   Testing/Procedures:  Your physician has requested that you have an echocardiogram. Echocardiography is a painless test that uses sound waves to create images of your heart. It provides your doctor with information about the size and shape of your heart and how well your heart's chambers and valves are working. This procedure takes approximately one hour. There are no restrictions for this procedure. Please do NOT wear cologne, perfume, aftershave, or lotions (deodorant is allowed). Please arrive 15 minutes prior to your appointment time.  Please note: We ask at that you not bring children with you during ultrasound (echo/ vascular) testing. Due to room size and safety concerns, children are not allowed in the ultrasound rooms during exams. Our front office staff cannot provide observation of children in our lobby area while testing is being conducted. An adult accompanying a patient to their appointment will only be allowed in the ultrasound room at the discretion of the ultrasound technician under special circumstances. We apologize for any inconvenience. [address]  Follow-Up: At [facility], you and your health needs are our priority.  As part of our continuing mission to provide you with exceptional heart care, our providers are all part of one team.  This team includes your primary Cardiologist (physician) and Advanced Practice Providers or APPs (Physician Assistants and Nurse Practitioners) who all work together to provide you with the care you need, when you need it.  Your next appointment:   6 month(s)  Provider:   REDELL SHALLOW MD

## 2024-04-19 DIAGNOSIS — F4321 Adjustment disorder with depressed mood: Secondary | ICD-10-CM | POA: Diagnosis not present

## 2024-04-26 DIAGNOSIS — F4321 Adjustment disorder with depressed mood: Secondary | ICD-10-CM | POA: Diagnosis not present

## 2024-05-25 ENCOUNTER — Ambulatory Visit (HOSPITAL_COMMUNITY)
Admission: RE | Admit: 2024-05-25 | Discharge: 2024-05-25 | Disposition: A | Discharge status: Home / Self Care | Source: Ambulatory Visit | Attending: Cardiovascular Disease | Admitting: Cardiovascular Disease

## 2024-05-25 DIAGNOSIS — R000 Tachycardia, unspecified: Secondary | ICD-10-CM | POA: Diagnosis not present

## 2024-05-25 DIAGNOSIS — I370 Nonrheumatic pulmonary valve stenosis: Secondary | ICD-10-CM | POA: Insufficient documentation

## 2024-05-25 LAB — ECHOCARDIOGRAM COMPLETE
Area-P 1/2: 3.99 cm2
S' Lateral: 3.1 cm

## 2024-05-26 ENCOUNTER — Ambulatory Visit: Payer: Self-pay | Admitting: Cardiology

## 2024-05-27 DIAGNOSIS — F4321 Adjustment disorder with depressed mood: Secondary | ICD-10-CM | POA: Diagnosis not present

## 2024-06-03 DIAGNOSIS — F4321 Adjustment disorder with depressed mood: Secondary | ICD-10-CM | POA: Diagnosis not present

## 2024-06-10 DIAGNOSIS — F4321 Adjustment disorder with depressed mood: Secondary | ICD-10-CM | POA: Diagnosis not present

## 2024-06-24 DIAGNOSIS — F4321 Adjustment disorder with depressed mood: Secondary | ICD-10-CM | POA: Diagnosis not present

## 2024-07-08 DIAGNOSIS — F4321 Adjustment disorder with depressed mood: Secondary | ICD-10-CM | POA: Diagnosis not present

## 2024-07-22 DIAGNOSIS — F4321 Adjustment disorder with depressed mood: Secondary | ICD-10-CM | POA: Diagnosis not present

## 2024-08-19 DIAGNOSIS — F4321 Adjustment disorder with depressed mood: Secondary | ICD-10-CM | POA: Diagnosis not present

## 2024-08-27 DIAGNOSIS — Z0000 Encounter for general adult medical examination without abnormal findings: Secondary | ICD-10-CM | POA: Diagnosis not present
